# Patient Record
Sex: Female | Born: 1990 | Marital: Single | Smoking: Current every day smoker
Health system: Southern US, Community
[De-identification: ages and names within clinical notes are randomized; demographics above are authoritative.]

## PROBLEM LIST (undated history)

## (undated) DIAGNOSIS — F988 Other specified behavioral and emotional disorders with onset usually occurring in childhood and adolescence: Secondary | ICD-10-CM

## (undated) HISTORY — PX: BIOPSY SHOULDER: PRO31

---
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
## 2012-01-26 ENCOUNTER — Encounter: Payer: PRIVATE HEALTH INSURANCE | Admitting: Physician Assistant

## 2012-04-30 NOTE — Progress Notes (Signed)
This encounter was created in error - please disregard.

## 2013-06-09 ENCOUNTER — Other Ambulatory Visit: Payer: Self-pay | Admitting: Family Medicine

## 2016-05-22 ENCOUNTER — Other Ambulatory Visit: Payer: Self-pay | Admitting: Family Medicine

## 2016-05-22 ENCOUNTER — Other Ambulatory Visit (HOSPITAL_COMMUNITY)
Admission: RE | Admit: 2016-05-22 | Discharge: 2016-05-22 | Disposition: A | Discharge status: Home / Self Care | Payer: BLUE CROSS/BLUE SHIELD | Source: Ambulatory Visit | Attending: Family Medicine | Admitting: Family Medicine

## 2016-05-22 ENCOUNTER — Other Ambulatory Visit (HOSPITAL_COMMUNITY): Admission: RE | Admit: 2016-05-22 | Payer: BLUE CROSS/BLUE SHIELD | Source: Ambulatory Visit

## 2016-05-22 DIAGNOSIS — Z0000 Encounter for general adult medical examination without abnormal findings: Secondary | ICD-10-CM | POA: Insufficient documentation

## 2016-05-24 LAB — CYTOLOGY - PAP: Diagnosis: NEGATIVE

## 2018-09-12 ENCOUNTER — Telehealth: Payer: Self-pay | Admitting: Genetics

## 2018-09-12 NOTE — Telephone Encounter (Signed)
A genetic counseling appt has been scheduled for the pt to see Darral Dash on 2/10 at 3pm. Pt aware to arrive 15 minutes early.

## 2018-09-15 ENCOUNTER — Other Ambulatory Visit: Payer: BLUE CROSS/BLUE SHIELD

## 2018-09-15 ENCOUNTER — Encounter: Payer: BLUE CROSS/BLUE SHIELD | Admitting: Genetics

## 2018-09-15 ENCOUNTER — Encounter: Payer: Self-pay | Admitting: Genetics

## 2018-09-16 ENCOUNTER — Telehealth: Payer: Self-pay | Admitting: Genetics

## 2018-09-16 ENCOUNTER — Encounter: Payer: Self-pay | Admitting: Genetics

## 2018-09-16 NOTE — Telephone Encounter (Addendum)
Called to touch base with patient regarding missed/no show to her genetic counseling appointment on 09/15/2018.  Left message asking if she wanted to reschedule and left contact information if she would like to reschedule.

## 2018-09-23 ENCOUNTER — Encounter: Payer: Self-pay | Admitting: Genetics

## 2018-09-23 ENCOUNTER — Inpatient Hospital Stay: Payer: Self-pay | Attending: Genetics | Admitting: Genetics

## 2018-09-23 ENCOUNTER — Inpatient Hospital Stay: Payer: Self-pay

## 2019-09-09 ENCOUNTER — Encounter (HOSPITAL_COMMUNITY): Payer: Self-pay | Admitting: Obstetrics and Gynecology

## 2019-09-09 ENCOUNTER — Other Ambulatory Visit: Payer: Self-pay

## 2019-09-09 ENCOUNTER — Inpatient Hospital Stay (HOSPITAL_COMMUNITY)
Admission: AD | Admit: 2019-09-09 | Discharge: 2019-09-09 | Disposition: A | Discharge status: Home / Self Care | Payer: 59 | Attending: Obstetrics and Gynecology | Admitting: Obstetrics and Gynecology

## 2019-09-09 ENCOUNTER — Inpatient Hospital Stay (HOSPITAL_COMMUNITY): Payer: 59

## 2019-09-09 ENCOUNTER — Inpatient Hospital Stay (HOSPITAL_BASED_OUTPATIENT_CLINIC_OR_DEPARTMENT_OTHER): Payer: 59

## 2019-09-09 DIAGNOSIS — R519 Headache, unspecified: Secondary | ICD-10-CM | POA: Diagnosis not present

## 2019-09-09 DIAGNOSIS — Z3689 Encounter for other specified antenatal screening: Secondary | ICD-10-CM

## 2019-09-09 DIAGNOSIS — Z363 Encounter for antenatal screening for malformations: Secondary | ICD-10-CM | POA: Diagnosis not present

## 2019-09-09 DIAGNOSIS — Z3A01 Less than 8 weeks gestation of pregnancy: Secondary | ICD-10-CM

## 2019-09-09 DIAGNOSIS — O4692 Antepartum hemorrhage, unspecified, second trimester: Secondary | ICD-10-CM

## 2019-09-09 DIAGNOSIS — O99332 Smoking (tobacco) complicating pregnancy, second trimester: Secondary | ICD-10-CM | POA: Diagnosis not present

## 2019-09-09 DIAGNOSIS — R42 Dizziness and giddiness: Secondary | ICD-10-CM | POA: Diagnosis present

## 2019-09-09 DIAGNOSIS — R55 Syncope and collapse: Secondary | ICD-10-CM

## 2019-09-09 DIAGNOSIS — O26892 Other specified pregnancy related conditions, second trimester: Secondary | ICD-10-CM

## 2019-09-09 DIAGNOSIS — R109 Unspecified abdominal pain: Secondary | ICD-10-CM | POA: Diagnosis not present

## 2019-09-09 DIAGNOSIS — O209 Hemorrhage in early pregnancy, unspecified: Secondary | ICD-10-CM | POA: Diagnosis not present

## 2019-09-09 DIAGNOSIS — Z3A15 15 weeks gestation of pregnancy: Secondary | ICD-10-CM | POA: Insufficient documentation

## 2019-09-09 DIAGNOSIS — O219 Vomiting of pregnancy, unspecified: Secondary | ICD-10-CM

## 2019-09-09 DIAGNOSIS — F17210 Nicotine dependence, cigarettes, uncomplicated: Secondary | ICD-10-CM | POA: Diagnosis not present

## 2019-09-09 HISTORY — DX: Other specified behavioral and emotional disorders with onset usually occurring in childhood and adolescence: F98.8

## 2019-09-09 LAB — URINALYSIS, ROUTINE W REFLEX MICROSCOPIC
Bilirubin Urine: NEGATIVE
Glucose, UA: NEGATIVE mg/dL
Hgb urine dipstick: NEGATIVE
Ketones, ur: NEGATIVE mg/dL
Leukocytes,Ua: NEGATIVE
Nitrite: NEGATIVE
Protein, ur: NEGATIVE mg/dL
Specific Gravity, Urine: 1.018 (ref 1.005–1.030)
pH: 6 (ref 5.0–8.0)

## 2019-09-09 LAB — CBC
HCT: 36.8 % (ref 36.0–46.0)
Hemoglobin: 12.5 g/dL (ref 12.0–15.0)
MCH: 30.4 pg (ref 26.0–34.0)
MCHC: 34 g/dL (ref 30.0–36.0)
MCV: 89.5 fL (ref 80.0–100.0)
Platelets: 261 K/uL (ref 150–400)
RBC: 4.11 MIL/uL (ref 3.87–5.11)
RDW: 12.9 % (ref 11.5–15.5)
WBC: 13.6 K/uL — ABNORMAL HIGH (ref 4.0–10.5)
nRBC: 0 % (ref 0.0–0.2)

## 2019-09-09 LAB — WET PREP, GENITAL
Clue Cells Wet Prep HPF POC: NONE SEEN
Sperm: NONE SEEN
Trich, Wet Prep: NONE SEEN
Yeast Wet Prep HPF POC: NONE SEEN

## 2019-09-09 LAB — COMPREHENSIVE METABOLIC PANEL
ALT: 11 U/L (ref 0–44)
AST: 12 U/L — ABNORMAL LOW (ref 15–41)
Albumin: 3.6 g/dL (ref 3.5–5.0)
Alkaline Phosphatase: 48 U/L (ref 38–126)
Anion gap: 9 (ref 5–15)
BUN: 5 mg/dL — ABNORMAL LOW (ref 6–20)
CO2: 24 mmol/L (ref 22–32)
Calcium: 9.3 mg/dL (ref 8.9–10.3)
Chloride: 102 mmol/L (ref 98–111)
Creatinine, Ser: 0.39 mg/dL — ABNORMAL LOW (ref 0.44–1.00)
GFR calc Af Amer: >60 mL/min (ref >60)
GFR calc non Af Amer: >60 mL/min (ref >60)
Glucose, Bld: 83 mg/dL (ref 70–99)
Potassium: 4 mmol/L (ref 3.5–5.1)
Sodium: 135 mmol/L (ref 135–145)
Total Bilirubin: 0.4 mg/dL (ref 0.3–1.2)
Total Protein: 6.7 g/dL (ref 6.5–8.1)

## 2019-09-09 LAB — ABO/RH: ABO/RH(D): O POS

## 2019-09-09 LAB — HCG, QUANTITATIVE, PREGNANCY: hCG, Beta Chain, Quant, S: 38964 m[IU]/mL — ABNORMAL HIGH (ref <5)

## 2019-09-09 LAB — POCT PREGNANCY, URINE: Preg Test, Ur: POSITIVE — AB

## 2019-09-09 LAB — HIV ANTIBODY (ROUTINE TESTING W REFLEX): HIV Screen 4th Generation wRfx: NONREACTIVE

## 2019-09-09 MED ORDER — METOCLOPRAMIDE HCL 10 MG PO TABS: 10.0000 mg | ORAL_TABLET | Freq: Four times a day (QID) | ORAL | 0 refills | Status: AC

## 2019-09-09 NOTE — MAU Note (Signed)
Presents with c/o "passing out" x2 today and dizziness.  Reports took 2 HPT today, both positive.  States had bright red VB today while showering, nothing since.  Denies abdominal pain or cramping.  LMP December, date unknown.

## 2019-09-09 NOTE — MAU Provider Note (Addendum)
Chief Complaint: Fainted, Dizzy, and Headache   None    SUBJECTIVE HPI: Meredith Werner is a 29 y.o.  who presents to [facility] reporting 2 fainting episodes, vomiting, vaginal bleeding and a positive pregnancy test. [name] woke up this morning and while she was showering, she had a feeling of dizziness and "tunnel vision" where her vision started narrowing until all she saw was black. At that moment she realized she was falling, but was able to hold herself up. She did not loose consciousness. While showering she noticed vaginal bleeding similar to her period.The second episode occurred right after while she was getting ready for work. That time she let herself fall into the bed.  She has been nauseas and vomited twice this morning. The vomit looked like bile with no blood. She also reports an intense headache all day long, as well as lower abdominal pressure.   Based on her symptoms she decided to take a pregnancy test which was positive. She has never been pregnant. Is currently on OCP's. Has taken the medication as prescribed. She is unsure when her last menstrual period was but thinks it may had been in early December she does remember the period to be very light, mostly spotting.   She drank 2 bottles of wine yesterday, but thinks this symptoms feel very different than being hungover.   Location: Lower abdominal Quality: Pressure Severity: NA Duration: All day  Timing: continuous  Modifying factors: None Associated signs and symptoms: In the HPI  Past Medical History:  Diagnosis Date  . Attention deficit disorder    OB History  Gravida Para Term Preterm AB Living  1            SAB TAB Ectopic Multiple Live Births               # Outcome Date GA Lbr Len/2nd Weight Sex Delivery Anes PTL Lv  1 Current            Past Surgical History:  Procedure Laterality Date  . BIOPSY SHOULDER Right    Social History   Socioeconomic History  . Marital status: Single    Spouse  name: Not on file  . Number of children: Not on file  . Years of education: Not on file  . Highest education level: Not on file  Occupational History  . Not on file  Tobacco Use  . Smoking status: Current Every Day Smoker    Types: Cigarettes  . Smokeless tobacco: Never Used  Substance and Sexual Activity  . Alcohol use: Yes    Comment: socially  . Drug use: Not Currently    Types: Marijuana    Comment: last january 2021  . Sexual activity: Yes  Other Topics Concern  . Not on file  Social History Narrative  . Not on file   Social Determinants of Health   Financial Resource Strain:   . Difficulty of Paying Living Expenses: Not on file  Food Insecurity:   . Worried About Programme researcher, broadcasting/film/video in the Last Year: Not on file  . Ran Out of Food in the Last Year: Not on file  Transportation Needs:   . Lack of Transportation (Medical): Not on file  . Lack of Transportation (Non-Medical): Not on file  Physical Activity:   . Days of Exercise per Week: Not on file  . Minutes of Exercise per Session: Not on file  Stress:   . Feeling of Stress : Not on file  Social Connections:   .  Frequency of Communication with Friends and Family: Not on file  . Frequency of Social Gatherings with Friends and Family: Not on file  . Attends Religious Services: Not on file  . Active Member of Clubs or Organizations: Not on file  . Attends Banker Meetings: Not on file  . Marital Status: Not on file  Intimate Partner Violence:   . Fear of Current or Ex-Partner: Not on file  . Emotionally Abused: Not on file  . Physically Abused: Not on file  . Sexually Abused: Not on file   Family History  Problem Relation Age of Onset  . Cancer Mother        Breast  . Cancer Father        Skin   No current facility-administered medications on file prior to encounter.   Current Outpatient Medications on File Prior to Encounter  Medication Sig Dispense Refill  . amphetamine-dextroamphetamine  (ADDERALL) 20 MG tablet Take 20 mg by mouth daily.    Marland Kitchen amphetamine-dextroamphetamine (ADDERALL) 5 MG tablet Take 5 mg by mouth. Takes about 3pm    . Norgestim-Eth Estrad Triphasic (TRI-SPRINTEC PO) Take by mouth.     No Known Allergies  I have reviewed patient's Past Medical Hx, Surgical Hx, Family Hx, Social Hx, medications and allergies.   Review of Systems  OBJECTIVE Patient Vitals for the past 24 hrs:  BP Temp Temp src Pulse Resp SpO2 Height Weight  09/09/19 1600 127/66 98.7 F (37.1 C) Oral (!) 103 20 100 % -- --  09/09/19 1558 -- -- -- -- -- -- 6\' 1"  (1.854 m) 85.1 kg   Constitutional: Well-developed, well-nourished female in no acute distress.  Cardiovascular: normal rate & rhythm, no murmur Respiratory: normal rate and effort. Lung sounds clear throughout GI: Abd soft, non-tender, Pos BS x 4. No guarding or rebound tenderness MS: Extremities nontender, no edema, normal ROM Neurologic: Alert and oriented x 4.  GU:     SPECULUM EXAM: NEFG, moderate quantity white/milky discharge, no blood noted, cervix clean  BIMANUAL: No CMT. cervix grossly normal w/ no lesions; uterus normal size, no adnexal tenderness or masses.    LAB RESULTS Results for orders placed or performed during the hospital encounter of 09/09/19 (from the past 24 hour(s))  Urinalysis, Routine w reflex microscopic     Status: None   Collection Time: 09/09/19  3:24 PM  Result Value Ref Range   Color, Urine YELLOW YELLOW   APPearance CLEAR CLEAR   Specific Gravity, Urine 1.018 1.005 - 1.030   pH 6.0 5.0 - 8.0   Glucose, UA NEGATIVE NEGATIVE mg/dL   Hgb urine dipstick NEGATIVE NEGATIVE   Bilirubin Urine NEGATIVE NEGATIVE   Ketones, ur NEGATIVE NEGATIVE mg/dL   Protein, ur NEGATIVE NEGATIVE mg/dL   Nitrite NEGATIVE NEGATIVE   Leukocytes,Ua NEGATIVE NEGATIVE  Pregnancy, urine POC     Status: Abnormal   Collection Time: 09/09/19  3:33 PM  Result Value Ref Range   Preg Test, Ur POSITIVE (A) NEGATIVE  Wet  prep, genital     Status: Abnormal   Collection Time: 09/09/19  5:20 PM   Specimen: Cervix  Result Value Ref Range   Yeast Wet Prep HPF POC NONE SEEN NONE SEEN   Trich, Wet Prep NONE SEEN NONE SEEN   Clue Cells Wet Prep HPF POC NONE SEEN NONE SEEN   WBC, Wet Prep HPF POC MANY (A) NONE SEEN   Sperm NONE SEEN   ABO/Rh     Status: None  Collection Time: 09/09/19  5:20 PM  Result Value Ref Range   ABO/RH(D) O POS    No rh immune globuloin      NOT A RH IMMUNE GLOBULIN CANDIDATE, PT RH POSITIVE Performed at [facility], [address] [place], [place] [address]   CBC     Status: Abnormal   Collection Time: 09/09/19  5:51 PM  Result Value Ref Range   WBC 13.6 (H) 4.0 - 10.5 K/uL   RBC 4.11 3.87 - 5.11 MIL/uL   Hemoglobin 12.5 12.0 - 15.0 g/dL   HCT 29.5 62.1 - 30.8 %   MCV 89.5 80.0 - 100.0 fL   MCH 30.4 26.0 - 34.0 pg   MCHC 34.0 30.0 - 36.0 g/dL   RDW 65.7 84.6 - 96.2 %   Platelets 261 150 - 400 K/uL   nRBC 0.0 0.0 - 0.2 %  Comprehensive metabolic panel     Status: Abnormal   Collection Time: 09/09/19  5:51 PM  Result Value Ref Range   Sodium 135 135 - 145 mmol/L   Potassium 4.0 3.5 - 5.1 mmol/L   Chloride 102 98 - 111 mmol/L   CO2 24 22 - 32 mmol/L   Glucose, Bld 83 70 - 99 mg/dL   BUN 5 (L) 6 - 20 mg/dL   Creatinine, Ser 9.52 (L) 0.44 - 1.00 mg/dL   Calcium 9.3 8.9 - 84.1 mg/dL   Total Protein 6.7 6.5 - 8.1 g/dL   Albumin 3.6 3.5 - 5.0 g/dL   AST 12 (L) 15 - 41 U/L   ALT 11 0 - 44 U/L   Alkaline Phosphatase 48 38 - 126 U/L   Total Bilirubin 0.4 0.3 - 1.2 mg/dL   GFR calc non Af Amer >60 >60 mL/min   GFR calc Af Amer >60 >60 mL/min   Anion gap 9 5 - 15    IMAGING No results found.  MAU COURSE Orders Placed This Encounter  Procedures  . Wet prep, genital  . US OB LESS THAN 14 WEEKS WITH OB TRANSVAGINAL  . Urinalysis, Routine w reflex microscopic  . CBC  . hCG, quantitative, pregnancy  . HIV Antibody (routine testing w rflx)  . Comprehensive  metabolic panel  . Pregnancy, urine POC  . ABO/Rh   No orders of the defined types were placed in this encounter.   MDM  ASSESSMENT Kaylyn Garrow is a 29 yo G1P0 who presents with a newly identified pregnancy, fainting and vaginal bleeding. We need to find the cause of her fainting, and rule out ectopic pregnancy.  1. Vaginal bleeding in pregnancy, first trimester      PLAN  1- Pregnancy: Still need to rule out ectopic pregnancy and confirm this is a viable pregnancy. Per patient, this is an unwanted pregnancy and will like to have information on abortion alternatives.  -OB US - ABO Rh typing - HIV antibody  - Wet prep  2- Fainting: Its possible this is 2/2 to dehydration from drinking EtOH the night before, but we need to rule out anemia and assess electrolyte status.  Discharge home in stable condition. - CBC -CMP   Manuela Neptune, Medical Student 09/09/2019  6:34 PM  I confirm that I have verified the information documented in the medical student's note and that I have also personally reperformed the history, physical exam and all medical decision making activities of this service and have verified that all service and findings are accurately documented in this student's note.  Pt with improved dizziness in MAU after rest and PO fluids.  CBC, CMP wnl. Discussed normal anatomy ultrasound with pt with more advanced gestational age than expected at [redacted]w[redacted]d.  Pt tearful, did not plan to be pregnant right now and is moving to [place] to start a new job.  She is undecided about keeping the pregnancy. Possible subchorionic hemorrhage seen on [place] which may be source of bleeding and this was discussed with pt. No bleeding was seen on today's exam.  Rx for Reglan 10 mg Q 6 hours PRN for nausea. Pt encouraged to eat and drink more regularly, and especially increase protein in her diet. Return precautions reviewed.   1. Abdominal pain during pregnancy in second trimester   2. Vaginal  bleeding in pregnancy, first trimester   3. Vaginal bleeding in pregnancy, second trimester   4. Encounter to establish gestational age using ultrasound   94. Near syncope   6. Nausea and vomiting during pregnancy prior to [redacted] weeks gestation     Simona Huh 09/09/2019 8:06 PM

## 2019-09-09 NOTE — Progress Notes (Signed)
Written and verbal d/c instructions given and understanding voiced. 

## 2019-09-09 NOTE — Discharge Instructions (Signed)
[facility]  ° ° °[facility]       Phone: [phone] ° °[facility]   Phone: [phone] ° °[facility]  Phone: [phone] ° °[facility]  Phone: [phone] ° °[facility]  Phone: [phone] ° °[facility]   Phone: [phone] ° °[facility]       Phone: [phone] ° °[facility]    Phone: [phone]  ° °[facility]    Phone: [phone] ° °[facility]    Phone: [phone] ° °[facility]    Phone: [phone] ° °[facility]       Phone: [phone]  ° °[facility]  Phone: [phone] ° °[facility]    Phone: [phone] ° °[facility]   Phone: [phone] ° °[facility]      Phone: [phone] ° °[facility]    Phone: [phone] ° ° °afe Medications in Pregnancy  ° °Acne: °Benzoyl Peroxide °Salicylic Acid ° °Backache/Headache: °Tylenol: 2 regular strength every 4 hours OR °             2 Extra strength every 6 hours ° °Colds/Coughs/Allergies: °Benadryl (alcohol free) 25 mg every 6 hours as needed °Breath right strips °Claritin °Cepacol throat lozenges °Chloraseptic throat spray °Cold-Eeze- up to three times per day °Cough drops, alcohol free °Flonase (by prescription only) °Guaifenesin °Mucinex °Robitussin DM (plain only, alcohol free) °Saline nasal spray/drops °Sudafed (pseudoephedrine) & Actifed ** use only after [redacted] weeks gestation and if you do not have high blood pressure °Tylenol °Vicks Vaporub °Zinc lozenges °Zyrtec  ° °Constipation: °Colace °Ducolax suppositories °Fleet enema °Glycerin suppositories °Metamucil °Milk of  magnesia °Miralax °Senokot °Smooth move tea ° °Diarrhea: °Kaopectate °Imodium A-D ° °*NO pepto Bismol ° °Hemorrhoids: °Anusol °Anusol HC °Preparation H °Tucks ° °Indigestion: °Tums °Maalox °Mylanta °Zantac  °Pepcid ° °Insomnia: °Benadryl (alcohol free) 25mg every 6 hours as needed °Tylenol PM °Unisom, no Gelcaps ° °Leg Cramps: °Tums °MagGel ° °Nausea/Vomiting:  °Bonine °Dramamine °Emetrol °Ginger extract °Sea bands °Meclizine  °Nausea medication to take during pregnancy:  °Unisom (doxylamine succinate 25 mg tablets) Take one tablet daily at bedtime. If symptoms are not adequately controlled, the dose can be increased to a maximum recommended dose of two tablets daily (1/2 tablet in the morning, 1/2 tablet mid-afternoon and one at bedtime). °Vitamin B6 100mg tablets. Take one tablet twice a day (up to 200 mg per day). ° °Skin Rashes: °Aveeno products °Benadryl cream or 25mg every 6 hours as needed °Calamine Lotion °1% cortisone cream ° °Yeast infection: °Gyne-lotrimin 7 °Monistat 7 ° ° °**If taking multiple medications, please check labels to avoid duplicating the same active ingredients °**take medication as directed on the label °** Do not exceed 4000 mg of tylenol in 24 hours °**Do not take medications that contain aspirin or ibuprofen ° ° °

## 2019-09-10 LAB — GC/CHLAMYDIA PROBE AMP (~~LOC~~) NOT AT ARMC
Chlamydia: NEGATIVE
Comment: NEGATIVE
Comment: NORMAL
Neisseria Gonorrhea: NEGATIVE
